# Patient Record
Sex: Male | Born: 1985 | Hispanic: Yes | Marital: Single | Smoking: Never smoker
Health system: Southern US, Community
[De-identification: ages and names within clinical notes are randomized; demographics above are authoritative.]

## PROBLEM LIST (undated history)

## (undated) DIAGNOSIS — I10 Essential (primary) hypertension: Secondary | ICD-10-CM

---

## 2016-03-22 ENCOUNTER — Emergency Department (HOSPITAL_COMMUNITY): Payer: Self-pay

## 2016-03-22 ENCOUNTER — Emergency Department (HOSPITAL_COMMUNITY)
Admission: EM | Admit: 2016-03-22 | Discharge: 2016-03-22 | Disposition: A | Discharge status: Home / Self Care | Payer: Self-pay | Attending: Emergency Medicine | Admitting: Emergency Medicine

## 2016-03-22 ENCOUNTER — Encounter (HOSPITAL_COMMUNITY): Payer: Self-pay | Admitting: *Deleted

## 2016-03-22 DIAGNOSIS — Y9389 Activity, other specified: Secondary | ICD-10-CM | POA: Insufficient documentation

## 2016-03-22 DIAGNOSIS — S0231XA Fracture of orbital floor, right side, initial encounter for closed fracture: Secondary | ICD-10-CM | POA: Insufficient documentation

## 2016-03-22 DIAGNOSIS — Y9289 Other specified places as the place of occurrence of the external cause: Secondary | ICD-10-CM | POA: Insufficient documentation

## 2016-03-22 DIAGNOSIS — S0011XA Contusion of right eyelid and periocular area, initial encounter: Secondary | ICD-10-CM | POA: Insufficient documentation

## 2016-03-22 DIAGNOSIS — S30811A Abrasion of abdominal wall, initial encounter: Secondary | ICD-10-CM | POA: Insufficient documentation

## 2016-03-22 DIAGNOSIS — S40212A Abrasion of left shoulder, initial encounter: Secondary | ICD-10-CM | POA: Insufficient documentation

## 2016-03-22 DIAGNOSIS — Y998 Other external cause status: Secondary | ICD-10-CM | POA: Insufficient documentation

## 2016-03-22 DIAGNOSIS — S0285XA Fracture of orbit, unspecified, initial encounter for closed fracture: Secondary | ICD-10-CM

## 2016-03-22 DIAGNOSIS — I10 Essential (primary) hypertension: Secondary | ICD-10-CM | POA: Insufficient documentation

## 2016-03-22 HISTORY — DX: Essential (primary) hypertension: I10

## 2016-03-22 MED ORDER — AMOXICILLIN-POT CLAVULANATE 875-125 MG PO TABS: 1.0000 | ORAL_TABLET | Freq: Two times a day (BID) | ORAL | Status: AC

## 2016-03-22 MED ORDER — OXYCODONE-ACETAMINOPHEN 5-325 MG PO TABS: 1.0000 | ORAL_TABLET | Freq: Four times a day (QID) | ORAL | Status: AC | PRN

## 2016-03-22 MED ORDER — OXYCODONE-ACETAMINOPHEN 5-325 MG PO TABS
1.0000 | ORAL_TABLET | ORAL | Status: DC | PRN
  Administered 2016-03-22: 1 via ORAL

## 2016-03-22 MED ORDER — OXYCODONE-ACETAMINOPHEN 5-325 MG PO TABS
1.0000 | ORAL_TABLET | Freq: Once | ORAL | Status: AC
  Administered 2016-03-22: 1 via ORAL
  Filled 2016-03-22: qty 1

## 2016-03-22 MED ORDER — OXYCODONE-ACETAMINOPHEN 5-325 MG PO TABS
ORAL_TABLET | ORAL | Status: AC
  Filled 2016-03-22: qty 1

## 2016-03-22 NOTE — Discharge Instructions (Signed)
You have an appointment with the facial specialist on Thursday at 2:10 at the clinic listed. If you cannot make this appointment please call their office to reschedule. Use pain medication only as needed for severe pain - This can make you very drowsy - please do not drink or drive on this medication. He can also use Tylenol and/or ibuprofen for additional pain relief. Continue to ice your eye 3-4 times daily. Take your antibiotic until completion. Return to the ER for any new or worsening symptoms, any additional concerns.
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
## 2016-03-22 NOTE — ED Notes (Signed)
PA at bedside.
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
## 2016-03-22 NOTE — ED Notes (Signed)
Ice pack given

## 2016-03-25 ENCOUNTER — Emergency Department (HOSPITAL_COMMUNITY)
Admission: EM | Admit: 2016-03-25 | Discharge: 2016-03-25 | Disposition: A | Discharge status: Home / Self Care | Payer: Self-pay | Attending: Emergency Medicine | Admitting: Emergency Medicine

## 2016-03-25 ENCOUNTER — Ambulatory Visit (INDEPENDENT_AMBULATORY_CARE_PROVIDER_SITE_OTHER): Payer: Self-pay | Admitting: Otolaryngology

## 2016-03-25 ENCOUNTER — Encounter (HOSPITAL_COMMUNITY): Payer: Self-pay | Admitting: Adult Health

## 2016-03-25 DIAGNOSIS — H1131 Conjunctival hemorrhage, right eye: Secondary | ICD-10-CM | POA: Insufficient documentation

## 2016-03-25 DIAGNOSIS — S0512XD Contusion of eyeball and orbital tissues, left eye, subsequent encounter: Secondary | ICD-10-CM | POA: Insufficient documentation

## 2016-03-25 DIAGNOSIS — S0280XD Fracture of other specified skull and facial bones, unspecified side, subsequent encounter for fracture with routine healing: Secondary | ICD-10-CM | POA: Insufficient documentation

## 2016-03-25 DIAGNOSIS — F1010 Alcohol abuse, uncomplicated: Secondary | ICD-10-CM | POA: Insufficient documentation

## 2016-03-25 DIAGNOSIS — S023XXA Fracture of orbital floor, initial encounter for closed fracture: Secondary | ICD-10-CM

## 2016-03-25 DIAGNOSIS — X58XXXD Exposure to other specified factors, subsequent encounter: Secondary | ICD-10-CM | POA: Insufficient documentation

## 2016-03-25 DIAGNOSIS — S40012D Contusion of left shoulder, subsequent encounter: Secondary | ICD-10-CM | POA: Insufficient documentation

## 2016-03-25 DIAGNOSIS — S40011D Contusion of right shoulder, subsequent encounter: Secondary | ICD-10-CM | POA: Insufficient documentation

## 2016-03-25 DIAGNOSIS — S0511XD Contusion of eyeball and orbital tissues, right eye, subsequent encounter: Secondary | ICD-10-CM | POA: Insufficient documentation

## 2016-03-25 DIAGNOSIS — R569 Unspecified convulsions: Secondary | ICD-10-CM | POA: Insufficient documentation

## 2016-03-25 DIAGNOSIS — M791 Myalgia: Secondary | ICD-10-CM | POA: Insufficient documentation

## 2016-03-25 DIAGNOSIS — I10 Essential (primary) hypertension: Secondary | ICD-10-CM | POA: Insufficient documentation

## 2016-03-25 DIAGNOSIS — R110 Nausea: Secondary | ICD-10-CM | POA: Insufficient documentation

## 2016-03-25 DIAGNOSIS — Z792 Long term (current) use of antibiotics: Secondary | ICD-10-CM | POA: Insufficient documentation

## 2016-03-25 LAB — URINALYSIS, ROUTINE W REFLEX MICROSCOPIC
BILIRUBIN URINE: NEGATIVE
GLUCOSE, UA: NEGATIVE mg/dL
HGB URINE DIPSTICK: NEGATIVE
Ketones, ur: NEGATIVE mg/dL
Leukocytes, UA: NEGATIVE
Nitrite: NEGATIVE
PH: 6 (ref 5.0–8.0)
Protein, ur: NEGATIVE mg/dL
SPECIFIC GRAVITY, URINE: 1.015 (ref 1.005–1.030)

## 2016-03-25 LAB — CBC
HEMATOCRIT: 42.3 % (ref 39.0–52.0)
HEMOGLOBIN: 14.7 g/dL (ref 13.0–17.0)
MCH: 28.5 pg (ref 26.0–34.0)
MCHC: 34.8 g/dL (ref 30.0–36.0)
MCV: 82.1 fL (ref 78.0–100.0)
Platelets: 343 10*3/uL (ref 150–400)
RBC: 5.15 MIL/uL (ref 4.22–5.81)
RDW: 12.9 % (ref 11.5–15.5)
WBC: 12.2 10*3/uL — ABNORMAL HIGH (ref 4.0–10.5)

## 2016-03-25 LAB — BASIC METABOLIC PANEL
ANION GAP: 13 (ref 5–15)
BUN: 8 mg/dL (ref 6–20)
CALCIUM: 9.5 mg/dL (ref 8.9–10.3)
CO2: 25 mmol/L (ref 22–32)
Chloride: 97 mmol/L — ABNORMAL LOW (ref 101–111)
Creatinine, Ser: 0.76 mg/dL (ref 0.61–1.24)
GFR calc non Af Amer: >60 mL/min (ref >60)
GLUCOSE: 149 mg/dL — AB (ref 65–99)
Potassium: 4 mmol/L (ref 3.5–5.1)
Sodium: 135 mmol/L (ref 135–145)

## 2016-03-25 MED ORDER — ONDANSETRON 4 MG PO TBDP: 4.0000 mg | ORAL_TABLET | Freq: Three times a day (TID) | ORAL | Status: AC | PRN

## 2016-03-25 MED ORDER — LEVETIRACETAM 500 MG PO TABS
1000.0000 mg | ORAL_TABLET | Freq: Once | ORAL | Status: AC
  Administered 2016-03-25: 1000 mg via ORAL
  Filled 2016-03-25: qty 2

## 2016-03-25 MED ORDER — LORAZEPAM 2 MG/ML IJ SOLN
1.0000 mg | Freq: Once | INTRAMUSCULAR | Status: AC
  Administered 2016-03-25: 1 mg via INTRAVENOUS
  Filled 2016-03-25: qty 1

## 2016-03-25 MED ORDER — MORPHINE SULFATE (PF) 4 MG/ML IV SOLN
4.0000 mg | Freq: Once | INTRAVENOUS | Status: AC
  Administered 2016-03-25: 4 mg via INTRAVENOUS
  Filled 2016-03-25: qty 1

## 2016-03-25 MED ORDER — ONDANSETRON 4 MG PO TBDP
ORAL_TABLET | ORAL | Status: AC
  Filled 2016-03-25: qty 1

## 2016-03-25 MED ORDER — NAPROXEN 500 MG PO TABS: 500.0000 mg | ORAL_TABLET | Freq: Two times a day (BID) | ORAL | Status: AC

## 2016-03-25 MED ORDER — ONDANSETRON HCL 4 MG/2ML IJ SOLN
4.0000 mg | Freq: Once | INTRAMUSCULAR | Status: AC
  Administered 2016-03-25: 4 mg via INTRAVENOUS
  Filled 2016-03-25: qty 2

## 2016-03-25 MED ORDER — ONDANSETRON 4 MG PO TBDP
4.0000 mg | ORAL_TABLET | Freq: Once | ORAL | Status: AC
  Administered 2016-03-25: 4 mg via ORAL

## 2016-03-25 MED ORDER — CHLORDIAZEPOXIDE HCL 25 MG PO CAPS: ORAL_CAPSULE | ORAL | Status: AC
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
## 2016-03-25 NOTE — Discharge Instructions (Signed)
Alcohol Use Disorder °Alcohol use disorder is a mental disorder. It is not a one-time incident of heavy drinking. Alcohol use disorder is the excessive and uncontrollable use of alcohol over time that leads to problems with functioning in one or more areas of daily living. People with this disorder risk harming themselves and others when they drink to excess. Alcohol use disorder also can cause other mental disorders, such as mood and anxiety disorders, and serious physical problems. People with alcohol use disorder often misuse other drugs.  °Alcohol use disorder is common and widespread. Some people with this disorder drink alcohol to cope with or escape from negative life events. Others drink to relieve chronic pain or symptoms of mental illness. People with a family history of alcohol use disorder are at higher risk of losing control and using alcohol to excess.  °Drinking too much alcohol can cause injury, accidents, and health problems. One drink can be too much when you are: °· Working. °· Pregnant or breastfeeding. °· Taking medicines. Ask your doctor. °· Driving or planning to drive. °SYMPTOMS  °Signs and symptoms of alcohol use disorder may include the following:  °· Consumption of alcohol in larger amounts or over a longer period of time than intended. °· Multiple unsuccessful attempts to cut down or control alcohol use.   °· A great deal of time spent obtaining alcohol, using alcohol, or recovering from the effects of alcohol (hangover). °· A strong desire or urge to use alcohol (cravings).   °· Continued use of alcohol despite problems at work, school, or home because of alcohol use.   °· Continued use of alcohol despite problems in relationships because of alcohol use. °· Continued use of alcohol in situations when it is physically hazardous, such as driving a car. °· Continued use of alcohol despite awareness of a physical or psychological problem that is likely related to alcohol use. Physical  problems related to alcohol use can involve the brain, heart, liver, stomach, and intestines. Psychological problems related to alcohol use include intoxication, depression, anxiety, psychosis, delirium, and dementia.   °· The need for increased amounts of alcohol to achieve the same desired effect, or a decreased effect from the consumption of the same amount of alcohol (tolerance). °· Withdrawal symptoms upon reducing or stopping alcohol use, or alcohol use to reduce or avoid withdrawal symptoms. Withdrawal symptoms include: °· Racing heart. °· Hand tremor. °· Difficulty sleeping. °· Nausea. °· Vomiting. °· Hallucinations. °· Restlessness. °· Seizures. °DIAGNOSIS °Alcohol use disorder is diagnosed through an assessment by your health care provider. Your health care provider may start by asking three or four questions to screen for excessive or problematic alcohol use. To confirm a diagnosis of alcohol use disorder, at least two symptoms must be present within a 12-month period. The severity of alcohol use disorder depends on the number of symptoms: °· Mild--two or three. °· Moderate--four or five. °· Severe--six or more. °Your health care provider may perform a physical exam or use results from lab tests to see if you have physical problems resulting from alcohol use. Your health care provider may refer you to a mental health professional for evaluation. °TREATMENT  °Some people with alcohol use disorder are able to reduce their alcohol use to low-risk levels. Some people with alcohol use disorder need to quit drinking alcohol. When necessary, mental health professionals with specialized training in substance use treatment can help. Your health care provider can help you decide how severe your alcohol use disorder is and what type of treatment you need.   The following forms of treatment are available:   Detoxification. Detoxification involves the use of prescription medicines to prevent alcohol withdrawal  symptoms in the first week after quitting. This is important for people with a history of symptoms of withdrawal and for heavy drinkers who are likely to have withdrawal symptoms. Alcohol withdrawal can be dangerous and, in severe cases, cause death. Detoxification is usually provided in a hospital or in-patient substance use treatment facility.  Counseling or talk therapy. Talk therapy is provided by substance use treatment counselors. It addresses the reasons people use alcohol and ways to keep them from drinking again. The goals of talk therapy are to help people with alcohol use disorder find healthy activities and ways to cope with life stress, to identify and avoid triggers for alcohol use, and to handle cravings, which can cause relapse.  Medicines.Different medicines can help treat alcohol use disorder through the following actions:  Decrease alcohol cravings.  Decrease the positive reward response felt from alcohol use.  Produce an uncomfortable physical reaction when alcohol is used (aversion therapy).  Support groups. Support groups are run by people who have quit drinking. They provide emotional support, advice, and guidance. These forms of treatment are often combined. Some people with alcohol use disorder benefit from intensive combination treatment provided by specialized substance use treatment centers. Both inpatient and outpatient treatment programs are available.   This information is not intended to replace advice given to you by your health care provider. Make sure you discuss any questions you have with your health care provider.   Document Released: 01/13/2005 Document Revised: 12/27/2014 Document Reviewed: 03/15/2013 Elsevier Interactive Patient Education [employer]  Seizure, Adult A seizure means there is unusual activity in the brain. A seizure can cause changes in attention or behavior. Seizures often cause shaking (convulsions). Seizures often last from 30  seconds to 2 minutes. HOME CARE   If you are given medicines, take them exactly as told by your doctor.  Keep all doctor visits as told.  Do not swim or drive until your doctor says it is okay.  Teach others what to do if you have a seizure. They should:  Lay you on the ground.  Put a cushion under your head.  Loosen any tight clothing around your neck.  Turn you on your side.  Stay with you until you get better. GET HELP RIGHT AWAY IF:   The seizure lasts longer than 2 to 5 minutes.  The seizure is very bad.  The person does not wake up after the seizure.  The person's attention or behavior changes. Drive the person to the emergency room or call your local emergency services (911 in [place] MAKE SURE YOU:   Understand these instructions.  Will watch your condition.  Will get help right away if you are not doing well or get worse.   This information is not intended to replace advice given to you by your health care provider. Make sure you discuss any questions you have with your health care provider.   Document Released: 05/24/2008 Document Revised: 02/28/2012 Document Reviewed: 07/18/2013 Elsevier Interactive Patient Education 2016 [employer]. [employer] Guide Outpatient Counseling/Substance Abuse Adult The United Ways 211 is a great source of information about community services available.  Access by dialing 2-1-1 from anywhere in [place], or by website -  PooledIncome.pl.   Other Local Resources (Updated 12/2015)  Crisis Hotlines   Services     Area [employer]  Crisis Hotline, available 24 hours a day, 7 days a week: (847)025-1450 [facility], [place]   [facility]  Crisis Hotline, available 24 hours a day, 7 days a week: [phone] [facility], [place]  [facility]  Suicide Prevention Hotline, available 24 hours a day, 7 days a week: (671) 226-1501 [facility], [place]  [employer], available 24 hours a day, 7 days a week: [phone] [facility], [place]   [facility] Access to [employer], available 24 hours a day, 7 days a week: (702) 779-0396 All   [facility]  Crisis Hotline, available 24 hours a day, 7 days a week: (704) 588-9026 All   Other Local Resources (Updated 12/2015)  Outpatient Counseling/ Substance Abuse Programs  Services     Address and Phone Number  ADS ([facility]cohol and Drug Services)   Options include Individual counseling, group counseling, intensive outpatient program (several hours a day, several days a week)  Offers depression assessments  Provides methadone maintenance program [phone] [address] [place], [place] [address]   [facility]   Offers partial hospitalization/day treatment and DUI/DWI programs  [employer], private insurance [phone] [address] [place], [place] [address]  [facility]    Services include intensive outpatient program (several hours a day, several days a week), outpatient treatment, DUI/DWI services, family education  Also has some services specifically for [employer] transitional housing  [phone] [address] [place], [place] [address]     [facility]  [employer], private pay, and private insurance [phone] [address] [place], [place] [address]  [employer] [facility]  Services include individual counseling, substance abuse intensive outpatient program (several hours a day, several days a week), day treatment  Delene Loll, Medicaid, private insurance [phone] [address] [place], [place] [address]  [facility]   Offers substance abuse intensive outpatient program (several hours a day, several days a week), partial hospitalization program [phone] [address] [place], [place] [address]  [phone] [address] [place], [place] [address]  [phone] [address] [place], [place] [address]  [phone], [address] [place], [place] [address]  [facility]  Individual counseling only  Accepts private insurance only [phone] [address] [place], [place] [address]  [facility]  Methadone maintenance program [phone] [address] [place], [place] [address]  [facility]  Walk-In Clinic providing substance abuse and mental health counseling  Accepts Medicaid, Medicare, private insurance  Offers sliding scale for uninsured [phone] [address] [place], [place]   [facility] [place], [employer].  Offers individual counseling, and intensive in-home services [phone] [address] [place], [place] [address]  [facility] individual counseling, family counseling, group therapy, domestic violence counseling, consumer credit counseling  Accepts Medicare, Medicaid, private insurance  Offers sliding scale for uninsured [phone] [address] [place], [place] [address]  [phone] [facility], [address] [place], [place] [address]  [facility]  Offers individual, family and group counseling  3 locations - [place], [place], and [place]  [phone]  [address] [place], [place] [address]  [address] [place], [place] [address]  [address] [place], [place] [address]  [facility]    Offers psychiatric assessment, 8-week Intensive Outpatient Program (several hours a day, several times a week, daytime or evenings), early recovery  group, family Program, medication management  Private pay or private insurance only [phone], or  [phone] [address] [place], [place] [address]  [facility]  Offers individual, couples and family counseling  Accepts Medicaid,  private insurance, and sliding scale for uninsured [phone] [address] [place], [place] [address]  Len Blalockavid Fuller, MD  Individual counseling  Private insurance [phone] [address] [place], [place] [address]  [facility]   Offers assessment, substance abuse treatment, and behavioral health treatment [phone] [address] [place], [place] [address]  [facility]  Individual counseling  Accepts private insurance [phone] [address] [place], [place] [address]  [facility]  Individual counseling  Delene Lollccepts Medicare, private insurance [phone] [address] [place], [place] [address]  [facility]    Offers intensive outpatient program (several hours a day, several times a week)  Private pay, private insurance [phone] [facility] [place], [place]  [facility]  Individual counseling  Medicare, private insurance [phone] [address], [address] [place], [place] [address]  [facility]    Offers intensive outpatient program (several hours a day, several times a week) and partial hospitalization program [phone] [address] [place], [place] [address]  Emerson MonteParrish McKinney, MD  Individual counseling [phone] [address], [address] [place], [place] [address]  [facility]  Offers Christian counseling to individuals, couples, and families  Accepts Medicare and private insurance; offers sliding scale for uninsured [phone] [address] [place], [place] [address]  [facility] [phone] [address], [address] [place], [place] [address]  [facility]   Offers crisis counseling, individual counseling, group therapy, in-home therapy, domestic violence services, day treatment, DWI services, Administrator, artsCommunity Support Team (CST),  Assertive Community Treatment Team (ACTT), substance abuse Intensive Outpatient Program (several hours a day, several times a week)  2 locations - [place] [phone] [address] [place], [place] [address]  [phone] [address] [place], [place] [address]  [facility]     Individual counseling and group therapy  Accepts private insurance, [place]are, [place] [phone] [address] [place], [place]  [facility]  Offers individual and family counseling  Offers LGBTQ services  Accepts private insurance and private pay [phone] [address] [place], [place] [address]  [facility]    Offers individual counseling, group therapy, and outpatient detox  Accepts private insurance [phone] [address] [place], [place]  [facility]  Individual counseling  Accepts Medicare, private insurance [phone] [address], [address] [place], [place] [address]  [facility]  Individual counseling  Accepts Medicare, private insurance [phone] [address] [place], [place] [address]  [facility]   Offers substance abuse Intensive Outpatient Program (several hours a day, several times a week) [phone], or [phone] [place], [place]
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
